# Patient Record
Sex: Female | Born: 1970 | Race: Black or African American | Hispanic: No | Marital: Married
Health system: Southern US, Community
[De-identification: ages and names within clinical notes are randomized; demographics above are authoritative.]

---

## 2009-05-31 ENCOUNTER — Emergency Department: Payer: Self-pay | Admitting: Emergency Medicine

## 2017-11-24 ENCOUNTER — Other Ambulatory Visit: Payer: Self-pay

## 2017-11-24 ENCOUNTER — Emergency Department: Payer: No Typology Code available for payment source

## 2017-11-24 ENCOUNTER — Encounter: Payer: Self-pay | Admitting: Radiology

## 2017-11-24 ENCOUNTER — Emergency Department
Admission: EM | Admit: 2017-11-24 | Discharge: 2017-11-25 | Disposition: A | Discharge status: Home / Self Care | Payer: No Typology Code available for payment source | Attending: Emergency Medicine | Admitting: Emergency Medicine

## 2017-11-24 DIAGNOSIS — S0990XA Unspecified injury of head, initial encounter: Secondary | ICD-10-CM | POA: Diagnosis not present

## 2017-11-24 DIAGNOSIS — F10129 Alcohol abuse with intoxication, unspecified: Secondary | ICD-10-CM | POA: Insufficient documentation

## 2017-11-24 DIAGNOSIS — S299XXA Unspecified injury of thorax, initial encounter: Secondary | ICD-10-CM | POA: Diagnosis present

## 2017-11-24 DIAGNOSIS — Y939 Activity, unspecified: Secondary | ICD-10-CM | POA: Insufficient documentation

## 2017-11-24 DIAGNOSIS — S20211A Contusion of right front wall of thorax, initial encounter: Secondary | ICD-10-CM | POA: Diagnosis not present

## 2017-11-24 DIAGNOSIS — Y92410 Unspecified street and highway as the place of occurrence of the external cause: Secondary | ICD-10-CM | POA: Diagnosis not present

## 2017-11-24 DIAGNOSIS — Y998 Other external cause status: Secondary | ICD-10-CM | POA: Diagnosis not present

## 2017-11-24 DIAGNOSIS — R200 Anesthesia of skin: Secondary | ICD-10-CM | POA: Diagnosis not present

## 2017-11-24 DIAGNOSIS — F10929 Alcohol use, unspecified with intoxication, unspecified: Secondary | ICD-10-CM

## 2017-11-24 LAB — CBC WITH DIFFERENTIAL/PLATELET
Abs Immature Granulocytes: 0.04 10*3/uL (ref 0.00–0.07)
BASOS PCT: 0 %
Basophils Absolute: 0 10*3/uL (ref 0.0–0.1)
EOS ABS: 0 10*3/uL (ref 0.0–0.5)
EOS PCT: 0 %
HCT: 36.9 % (ref 36.0–46.0)
Hemoglobin: 12.3 g/dL (ref 12.0–15.0)
IMMATURE GRANULOCYTES: 1 %
Lymphocytes Relative: 40 %
Lymphs Abs: 2.7 10*3/uL (ref 0.7–4.0)
MCH: 30.8 pg (ref 26.0–34.0)
MCHC: 33.3 g/dL (ref 30.0–36.0)
MCV: 92.3 fL (ref 80.0–100.0)
MONO ABS: 0.3 10*3/uL (ref 0.1–1.0)
MONOS PCT: 5 %
NEUTROS PCT: 54 %
Neutro Abs: 3.6 10*3/uL (ref 1.7–7.7)
PLATELETS: 239 10*3/uL (ref 150–400)
RBC: 4 MIL/uL (ref 3.87–5.11)
RDW: 12.3 % (ref 11.5–15.5)
WBC: 6.6 10*3/uL (ref 4.0–10.5)
nRBC: 0 % (ref 0.0–0.2)

## 2017-11-24 LAB — POCT PREGNANCY, URINE: PREG TEST UR: NEGATIVE

## 2017-11-24 LAB — COMPREHENSIVE METABOLIC PANEL
ALT: 20 U/L (ref 0–44)
ANION GAP: 9 (ref 5–15)
AST: 21 U/L (ref 15–41)
Albumin: 4.1 g/dL (ref 3.5–5.0)
Alkaline Phosphatase: 94 U/L (ref 38–126)
BILIRUBIN TOTAL: 0.2 mg/dL — AB (ref 0.3–1.2)
BUN: 8 mg/dL (ref 6–20)
CHLORIDE: 111 mmol/L (ref 98–111)
CO2: 23 mmol/L (ref 22–32)
Calcium: 9.1 mg/dL (ref 8.9–10.3)
Creatinine, Ser: 0.56 mg/dL (ref 0.44–1.00)
Glucose, Bld: 115 mg/dL — ABNORMAL HIGH (ref 70–99)
POTASSIUM: 3.6 mmol/L (ref 3.5–5.1)
Sodium: 143 mmol/L (ref 135–145)
TOTAL PROTEIN: 7.7 g/dL (ref 6.5–8.1)

## 2017-11-24 LAB — ETHANOL: ALCOHOL ETHYL (B): 110 mg/dL — AB (ref <10)

## 2017-11-24 MED ORDER — IOPAMIDOL (ISOVUE-300) INJECTION 61%
100.0000 mL | Freq: Once | INTRAVENOUS | Status: AC | PRN
  Administered 2017-11-24: 100 mL via INTRAVENOUS

## 2017-11-24 MED ORDER — ONDANSETRON HCL 4 MG/2ML IJ SOLN
4.0000 mg | Freq: Once | INTRAMUSCULAR | Status: AC
  Administered 2017-11-25: 4 mg via INTRAVENOUS
  Filled 2017-11-24: qty 2
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
## 2017-11-24 NOTE — ED Notes (Signed)
Patient transported to CT 

## 2017-11-25 LAB — URINALYSIS, COMPLETE (UACMP) WITH MICROSCOPIC
BACTERIA UA: NONE SEEN
Bilirubin Urine: NEGATIVE
Glucose, UA: NEGATIVE mg/dL
Hgb urine dipstick: NEGATIVE
Ketones, ur: NEGATIVE mg/dL
LEUKOCYTES UA: NEGATIVE
Nitrite: NEGATIVE
PH: 6 (ref 5.0–8.0)
PROTEIN: NEGATIVE mg/dL
Specific Gravity, Urine: 1.033 — ABNORMAL HIGH (ref 1.005–1.030)

## 2017-11-25 MED ORDER — LORAZEPAM 2 MG/ML IJ SOLN
2.0000 mg | Freq: Once | INTRAMUSCULAR | Status: AC
  Administered 2017-11-25: 2 mg via INTRAVENOUS
  Filled 2017-11-25: qty 1

## 2017-11-25 MED ORDER — KETOROLAC TROMETHAMINE 30 MG/ML IJ SOLN
15.0000 mg | Freq: Once | INTRAMUSCULAR | Status: AC
  Administered 2017-11-25: 15 mg via INTRAVENOUS
  Filled 2017-11-25: qty 1

## 2017-11-25 MED ORDER — IBUPROFEN 600 MG PO TABS: 600.0000 mg | ORAL_TABLET | Freq: Three times a day (TID) | ORAL | 0 refills | Status: AC | PRN

## 2017-11-25 MED ORDER — LORAZEPAM 1 MG PO TABS: 1.0000 mg | ORAL_TABLET | Freq: Three times a day (TID) | ORAL | 0 refills | Status: AC | PRN
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
## 2018-08-21 ENCOUNTER — Ambulatory Visit (LOCAL_COMMUNITY_HEALTH_CENTER): Payer: Self-pay

## 2018-08-21 ENCOUNTER — Other Ambulatory Visit: Payer: Self-pay

## 2018-08-21 DIAGNOSIS — Z111 Encounter for screening for respiratory tuberculosis: Secondary | ICD-10-CM

## 2018-08-24 ENCOUNTER — Ambulatory Visit: Payer: Self-pay

## 2018-08-24 ENCOUNTER — Other Ambulatory Visit: Payer: Self-pay

## 2018-08-24 DIAGNOSIS — Z111 Encounter for screening for respiratory tuberculosis: Secondary | ICD-10-CM

## 2018-08-24 LAB — TB SKIN TEST
Induration: 0 mm
TB Skin Test: NEGATIVE
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
